# Patient Record
Sex: Female | Born: 1960 | Race: White | Hispanic: No | Marital: Married | Smoking: Current every day smoker
Health system: Southern US, Community
[De-identification: ages and names within clinical notes are randomized; demographics above are authoritative.]

## PROBLEM LIST (undated history)

## (undated) DIAGNOSIS — G4700 Insomnia, unspecified: Secondary | ICD-10-CM

## (undated) DIAGNOSIS — G8929 Other chronic pain: Secondary | ICD-10-CM

## (undated) DIAGNOSIS — M549 Dorsalgia, unspecified: Secondary | ICD-10-CM

## (undated) DIAGNOSIS — F419 Anxiety disorder, unspecified: Secondary | ICD-10-CM

## (undated) HISTORY — PX: PANNICULECTOMY: SUR1001

## (undated) HISTORY — PX: GASTRIC BYPASS: SHX52

## (undated) HISTORY — PX: TUBAL LIGATION: SHX77

## (undated) HISTORY — PX: CHOLECYSTECTOMY: SHX55

## (undated) HISTORY — PX: TONSILLECTOMY: SUR1361

## (undated) HISTORY — PX: ECTOPIC PREGNANCY SURGERY: SHX613

## (undated) HISTORY — PX: BACK SURGERY: SHX140

---

## 2015-02-08 ENCOUNTER — Encounter (HOSPITAL_BASED_OUTPATIENT_CLINIC_OR_DEPARTMENT_OTHER): Payer: Self-pay | Admitting: Emergency Medicine

## 2015-02-08 ENCOUNTER — Emergency Department (HOSPITAL_BASED_OUTPATIENT_CLINIC_OR_DEPARTMENT_OTHER)
Admission: EM | Admit: 2015-02-08 | Discharge: 2015-02-09 | Disposition: A | Discharge status: Home / Self Care | Payer: 59 | Attending: Emergency Medicine | Admitting: Emergency Medicine

## 2015-02-08 DIAGNOSIS — G8929 Other chronic pain: Secondary | ICD-10-CM | POA: Diagnosis not present

## 2015-02-08 DIAGNOSIS — Z880 Allergy status to penicillin: Secondary | ICD-10-CM | POA: Insufficient documentation

## 2015-02-08 DIAGNOSIS — Y9289 Other specified places as the place of occurrence of the external cause: Secondary | ICD-10-CM | POA: Diagnosis not present

## 2015-02-08 DIAGNOSIS — F17200 Nicotine dependence, unspecified, uncomplicated: Secondary | ICD-10-CM | POA: Insufficient documentation

## 2015-02-08 DIAGNOSIS — Z9889 Other specified postprocedural states: Secondary | ICD-10-CM

## 2015-02-08 DIAGNOSIS — Y998 Other external cause status: Secondary | ICD-10-CM | POA: Insufficient documentation

## 2015-02-08 DIAGNOSIS — W002XXA Other fall from one level to another due to ice and snow, initial encounter: Secondary | ICD-10-CM | POA: Insufficient documentation

## 2015-02-08 DIAGNOSIS — S82852A Displaced trimalleolar fracture of left lower leg, initial encounter for closed fracture: Secondary | ICD-10-CM | POA: Diagnosis not present

## 2015-02-08 DIAGNOSIS — Z8669 Personal history of other diseases of the nervous system and sense organs: Secondary | ICD-10-CM | POA: Diagnosis not present

## 2015-02-08 DIAGNOSIS — IMO0001 Reserved for inherently not codable concepts without codable children: Secondary | ICD-10-CM

## 2015-02-08 DIAGNOSIS — W009XXA Unspecified fall due to ice and snow, initial encounter: Secondary | ICD-10-CM | POA: Diagnosis not present

## 2015-02-08 DIAGNOSIS — Y9389 Activity, other specified: Secondary | ICD-10-CM | POA: Insufficient documentation

## 2015-02-08 DIAGNOSIS — Z8659 Personal history of other mental and behavioral disorders: Secondary | ICD-10-CM | POA: Diagnosis not present

## 2015-02-08 DIAGNOSIS — S99912A Unspecified injury of left ankle, initial encounter: Secondary | ICD-10-CM | POA: Diagnosis present

## 2015-02-08 HISTORY — DX: Other chronic pain: G89.29

## 2015-02-08 HISTORY — DX: Insomnia, unspecified: G47.00

## 2015-02-08 HISTORY — DX: Dorsalgia, unspecified: M54.9

## 2015-02-08 HISTORY — DX: Anxiety disorder, unspecified: F41.9

## 2015-02-08 NOTE — ED Notes (Addendum)
Pt slipped on ice and fell. Pt has obvious deformity to left ankle. Pt has good pedal pulse. Pt reports taking hydrocodone 7.5/325 mg at 2000 (regular scheduled dose prior to falling) and repeated the same dose at 2300 after falling. Pt is slightly hypotensive-pt states this is her normal BP range. Pt also reports drinking 6 beers tonight.

## 2015-02-09 ENCOUNTER — Emergency Department (HOSPITAL_BASED_OUTPATIENT_CLINIC_OR_DEPARTMENT_OTHER): Payer: 59

## 2015-02-09 MED ORDER — FENTANYL CITRATE (PF) 100 MCG/2ML IJ SOLN
100.0000 ug | Freq: Once | INTRAMUSCULAR | Status: AC
  Administered 2015-02-09: 100 ug via INTRAVENOUS
  Filled 2015-02-09: qty 2

## 2015-02-09 MED ORDER — ETOMIDATE 2 MG/ML IV SOLN
0.1500 mg/kg | Freq: Once | INTRAVENOUS | Status: AC
  Administered 2015-02-09: 12.94 mg via INTRAVENOUS
  Filled 2015-02-09: qty 10

## 2015-02-09 MED ORDER — OXYCODONE-ACETAMINOPHEN 5-325 MG PO TABS: 1.0000 | ORAL_TABLET | ORAL | Status: AC | PRN

## 2015-02-09 NOTE — Discharge Instructions (Signed)
Ankle Fracture A fracture is a break in a bone. The ankle joint is made up of three bones. These include the lower (distal)sections of your lower leg bones, called the tibia and fibula, along with a bone in your foot, called the talus. Depending on how bad the break is and if more than one ankle joint bone is broken, a cast or splint is used to protect and keep your injured bone from moving while it heals. Sometimes, surgery is required to help the fracture heal properly.  There are two general types of fractures:  Stable fracture. This includes a single fracture line through one bone, with no injury to ankle ligaments. A fracture of the talus that does not have any displacement (movement of the bone on either side of the fracture line) is also stable.  Unstable fracture. This includes more than one fracture line through one or more bones in the ankle joint. It also includes fractures that have displacement of the bone on either side of the fracture line. CAUSES  A direct blow to the ankle.   Quickly and severely twisting your ankle.  Trauma, such as a car accident or falling from a significant height. RISK FACTORS You may be at a higher risk of ankle fracture if:  You have certain medical conditions.  You are involved in high-impact sports.  You are involved in a high-impact car accident. SIGNS AND SYMPTOMS   Tender and swollen ankle.  Bruising around the injured ankle.  Pain on movement of the ankle.  Difficulty walking or putting weight on the ankle.  A cold foot below the site of the ankle injury. This can occur if the blood vessels passing through your injured ankle were also damaged.  Numbness in the foot below the site of the ankle injury. DIAGNOSIS  An ankle fracture is usually diagnosed with a physical exam and X-rays. A CT scan may also be required for complex fractures. TREATMENT  Stable fractures are treated with a cast or splint and using crutches to avoid putting  weight on your injured ankle. This is followed by an ankle strengthening program. Some patients require a special type of cast, depending on other medical problems they may have. Unstable fractures require surgery to ensure the bones heal properly. Your health care provider will tell you what type of fracture you have and the best treatment for your condition. HOME CARE INSTRUCTIONS   Review correct crutch use with your health care provider and use your crutches as directed. Safe use of crutches is extremely important. Misuse of crutches can cause you to fall or cause injury to nerves in your hands or armpits.  Do not put weight or pressure on the injured ankle until directed by your health care provider.  To lessen the swelling, keep the injured leg elevated while sitting or lying down.  Apply ice to the injured area:  Put ice in a plastic bag.  Place a towel between your cast and the bag.  Leave the ice on for 20 minutes, 2-3 times a day.  If you have a plaster or fiberglass cast:  Do not try to scratch the skin under the cast with any objects. This can increase your risk of skin infection.  Check the skin around the cast every day. You may put lotion on any red or sore areas.  Keep your cast dry and clean.  If you have a plaster splint:  Wear the splint as directed.  You may loosen the elastic   around the splint if your toes become numb, tingle, or turn cold or blue.  Do not put pressure on any part of your cast or splint; it may break. Rest your cast only on a pillow the first 24 hours until it is fully hardened.  Your cast or splint can be protected during bathing with a plastic bag sealed to your skin with medical tape. Do not lower the cast or splint into water.  Take medicines as directed by your health care provider. Only take over-the-counter or prescription medicines for pain, discomfort, or fever as directed by your health care provider.  Do not drive a vehicle until  your health care provider specifically tells you it is safe to do so.  If your health care provider has given you a follow-up appointment, it is very important to keep that appointment. Not keeping the appointment could result in a chronic or permanent injury, pain, and disability. If you have any problem keeping the appointment, call the facility for assistance. SEEK MEDICAL CARE IF: You develop increased swelling or discomfort. SEEK IMMEDIATE MEDICAL CARE IF:   Your cast gets damaged or breaks.  You have continued severe pain.  You develop new pain or swelling after the cast was put on.  Your skin or toenails below the injury turn blue or gray.  Your skin or toenails below the injury feel cold, numb, or have loss of sensitivity to touch.  There is a bad smell or pus draining from under the cast. MAKE SURE YOU:   Understand these instructions.  Will watch your condition.  Will get help right away if you are not doing well or get worse.   This information is not intended to replace advice given to you by your health care provider. Make sure you discuss any questions you have with your health care provider.   Document Released: 01/13/2000 Document Revised: 01/20/2013 Document Reviewed: 08/14/2012 [employer] Interactive Patient Education 2016 [employer]  

## 2015-02-09 NOTE — ED Provider Notes (Addendum)
CSN: [id number]     Arrival date & time 02/08/15  2341 History   First MD Initiated Contact with Patient 02/09/15 0037     Chief Complaint  Patient presents with  . Ankle Injury     (Consider location/radiation/quality/duration/timing/severity/associated sxs/prior Treatment) HPI  This is a 55 year old female on chronic narcotics for low back pain. She slipped and fell on the ice about 9 PM yesterday evening. She is complaining of deformity and moderate to severe pain in her left ankle. She is not able to bear weight on the left ankle. She has felt crepitus in the left ankle. She denies other injury. She had been drinking beer yesterday evening. Her blood pressure is noted to be 100/48 but she states her blood pressure is usually low.  Past Medical History  Diagnosis Date  . Chronic back pain   . Insomnia   . Anxiety    Past Surgical History  Procedure Laterality Date  . Back surgery    . Tubal ligation    . Ectopic pregnancy surgery    . Tonsillectomy    . Panniculectomy    . Gastric bypass    . Cholecystectomy     No family history on file. Social History  Substance Use Topics  . Smoking status: Current Every Day Smoker  . Smokeless tobacco: None  . Alcohol Use: 3.6 oz/week    6 Cans of beer per week   OB History    No data available     Review of Systems  All other systems reviewed and are negative.   Allergies  Penicillins  Home Medications   Prior to Admission medications   Medication Sig Start Date End Date Taking? Authorizing Provider  HYDROcodone-acetaminophen (NORCO) 7.5-325 MG tablet Take 1 tablet by mouth every 4 (four) hours as needed for moderate pain.   Yes Historical Provider, MD   BP 105/56 mmHg  Pulse 69  Temp(Src) 98 F (36.7 C) (Oral)  Resp 11  Ht 5' (1.524 m)  Wt 190 lb (86.183 kg)  BMI 37.11 kg/m2  SpO2 99%   Physical Exam  General: Well-developed, well-nourished female in no acute distress; appearance consistent with age of  record HENT: normocephalic; atraumatic Eyes: pupils equal, round and reactive to light; extraocular muscles intact Neck: supple; no C-spine tenderness Heart: regular rate and rhythm Lungs: clear to auscultation bilaterally Abdomen: soft; nondistended; nontender; bowel sounds present Back: No spinal tenderness Extremities: Pulses normal; deformity, tenderness and decreased range of motion of left ankle, left foot distally neurovascularly intact, no proximal fibular tenderness Neurologic: Awake, alert and oriented; motor function intact in all extremities and symmetric; no facial droop Skin: Warm and dry Psychiatric: Normal mood and affect    ED Course  Procedures (including critical care time)  Procedural sedation Performed by: Barth Trella L Consent: Verbal consent obtained. Risks and benefits: risks, benefits and alternatives were discussed Required items: required blood products, implants, devices, and special equipment available Patient identity confirmed: arm band and provided demographic data Time out: Immediately prior to procedure a "time out" was called to verify the correct patient, procedure, equipment, support staff and site/side marked as required.  Sedation type: moderate (conscious) sedation NPO time confirmed and considedered  Sedatives: ETOMIDATE  Physician Time at Bedside: 10 minutes  Vitals: Vital signs were monitored during sedation. Cardiac Monitor, pulse oximeter Patient tolerance: Patient tolerated the procedure well with no immediate complications. Comments: Pt with uneventful recovered. Returned to pre-procedural sedation baseline  CLOSED REDUCTION AND SPLINTING After adequate  sedation was obtained using etomidate as above the patient's ankle was reduced using traction. A posterior short leg splint and stirrup splint were then applied with the assistance of the orthopedic technician. The patient tolerated this well and there were no immediate  complications. The foot remains neurovascularly intact. Follow-up x-ray is pending.   MDM  Nursing notes and vitals signs, including pulse oximetry, reviewed.  Summary of this visit's results, reviewed by myself:  Imaging Studies: Dg Ankle Complete Left  02/09/2015  CLINICAL DATA:  55 year old female with left ankle injury. EXAM: LEFT ANKLE COMPLETE - 3+ VIEW COMPARISON:  None. FINDINGS: There is comminuted fracture of the lateral malleolus with lateral angulation of the distal fracture fragment. There is a laterally displaced fracture of the medial malleolus. There is also fracture of the posterior tibial plafond. There is lateral dislocation of the ankle joint. There is diffuse soft tissue swelling of the ankle. IMPRESSION: Trimalleolar fracture with lateral dislocation of the ankle. Electronically Signed   By: Elgie CollardArash  Radparvar M.D.   On: 02/09/2015 00:29   Dg Ankle Left Port  02/09/2015  CLINICAL DATA:  Ankle fracture, postreduction. EXAM: PORTABLE LEFT ANKLE - 2 VIEW COMPARISON:  Pre reduction radiographs earlier today. FINDINGS: Improved alignment of trimalleolar fracture postreduction. There is residual talar tilt in widening of the medial mortise. Overlying cast material limits osseous and soft tissue fine detail. IMPRESSION: Improved alignment of trimalleolar fracture postreduction with residual talar tilt. Electronically Signed   By: Rubye OaksMelanie  Ehinger M.D.   On: 02/09/2015 02:19   Patient is on a pain management contract. We will provide only a few additional pain pills to help keep her comfortable until she contacts her pain management physician later today.    Paula LibraJohn Ritaj Dullea, MD 02/09/15 [id number]  Paula LibraJohn Chanell Nadeau, MD 02/09/15 [id number]

## 2015-02-09 NOTE — Sedation Documentation (Signed)
Cast being placed at this time.

## 2017-05-29 IMAGING — DX DG ANKLE COMPLETE 3+V*L*
3 series · 3 of 3 positions shown · non-contrast
Comparison: None.

CLINICAL DATA: 54-year-old female with left ankle injury.

EXAM:
LEFT ANKLE COMPLETE - 3+ VIEW

[ankle ap]
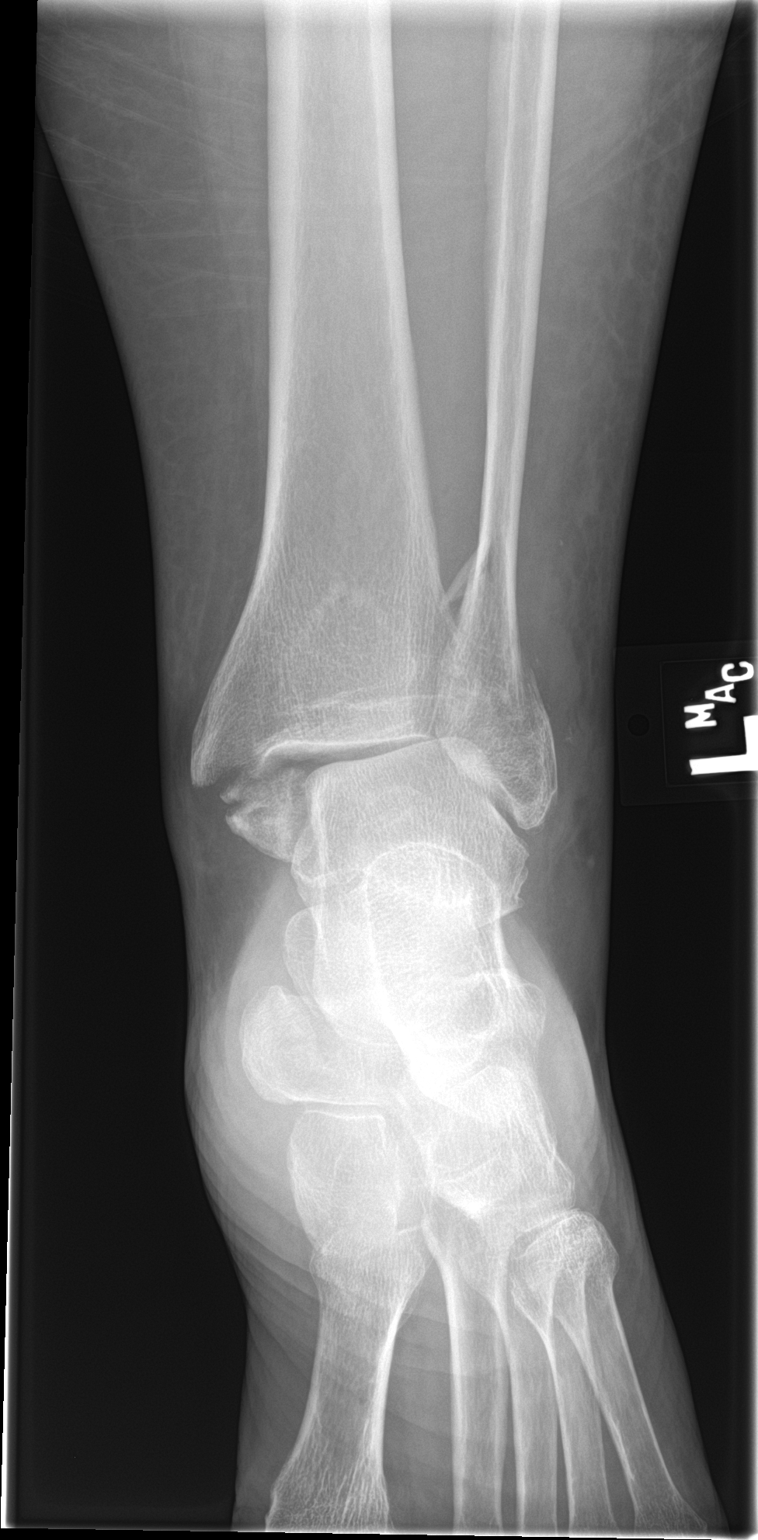

[ankle obl]
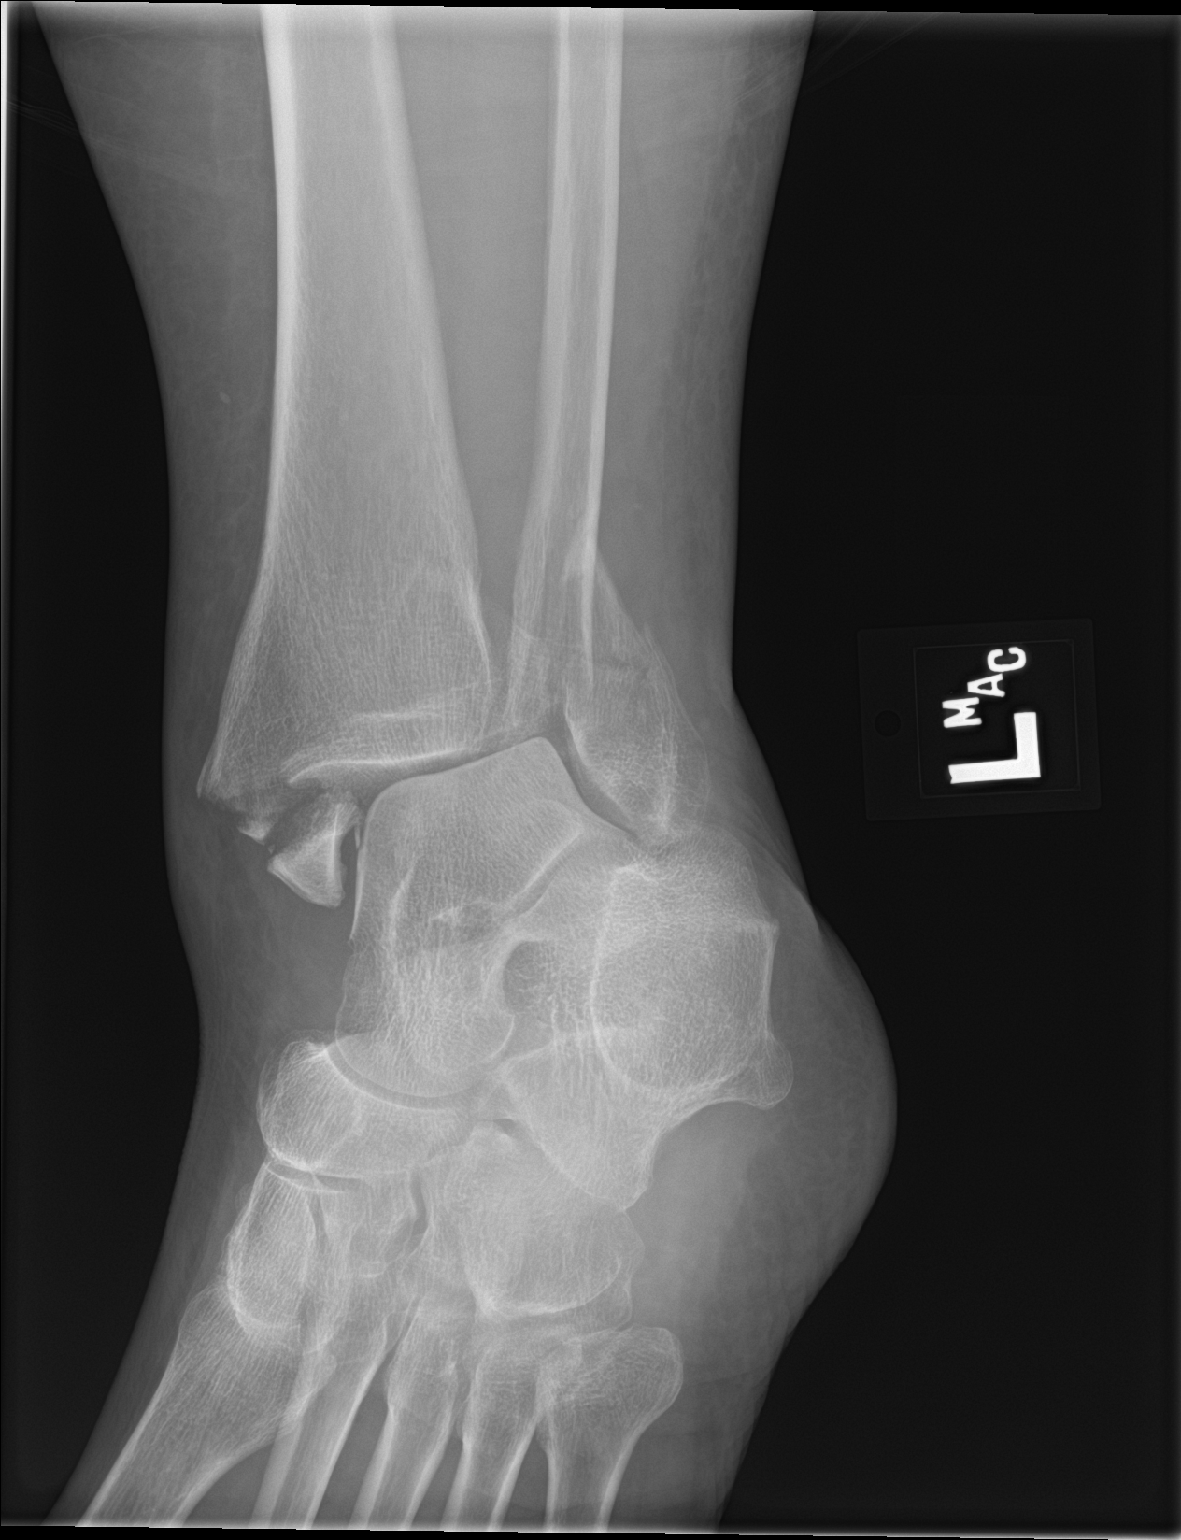

[ankle lat]
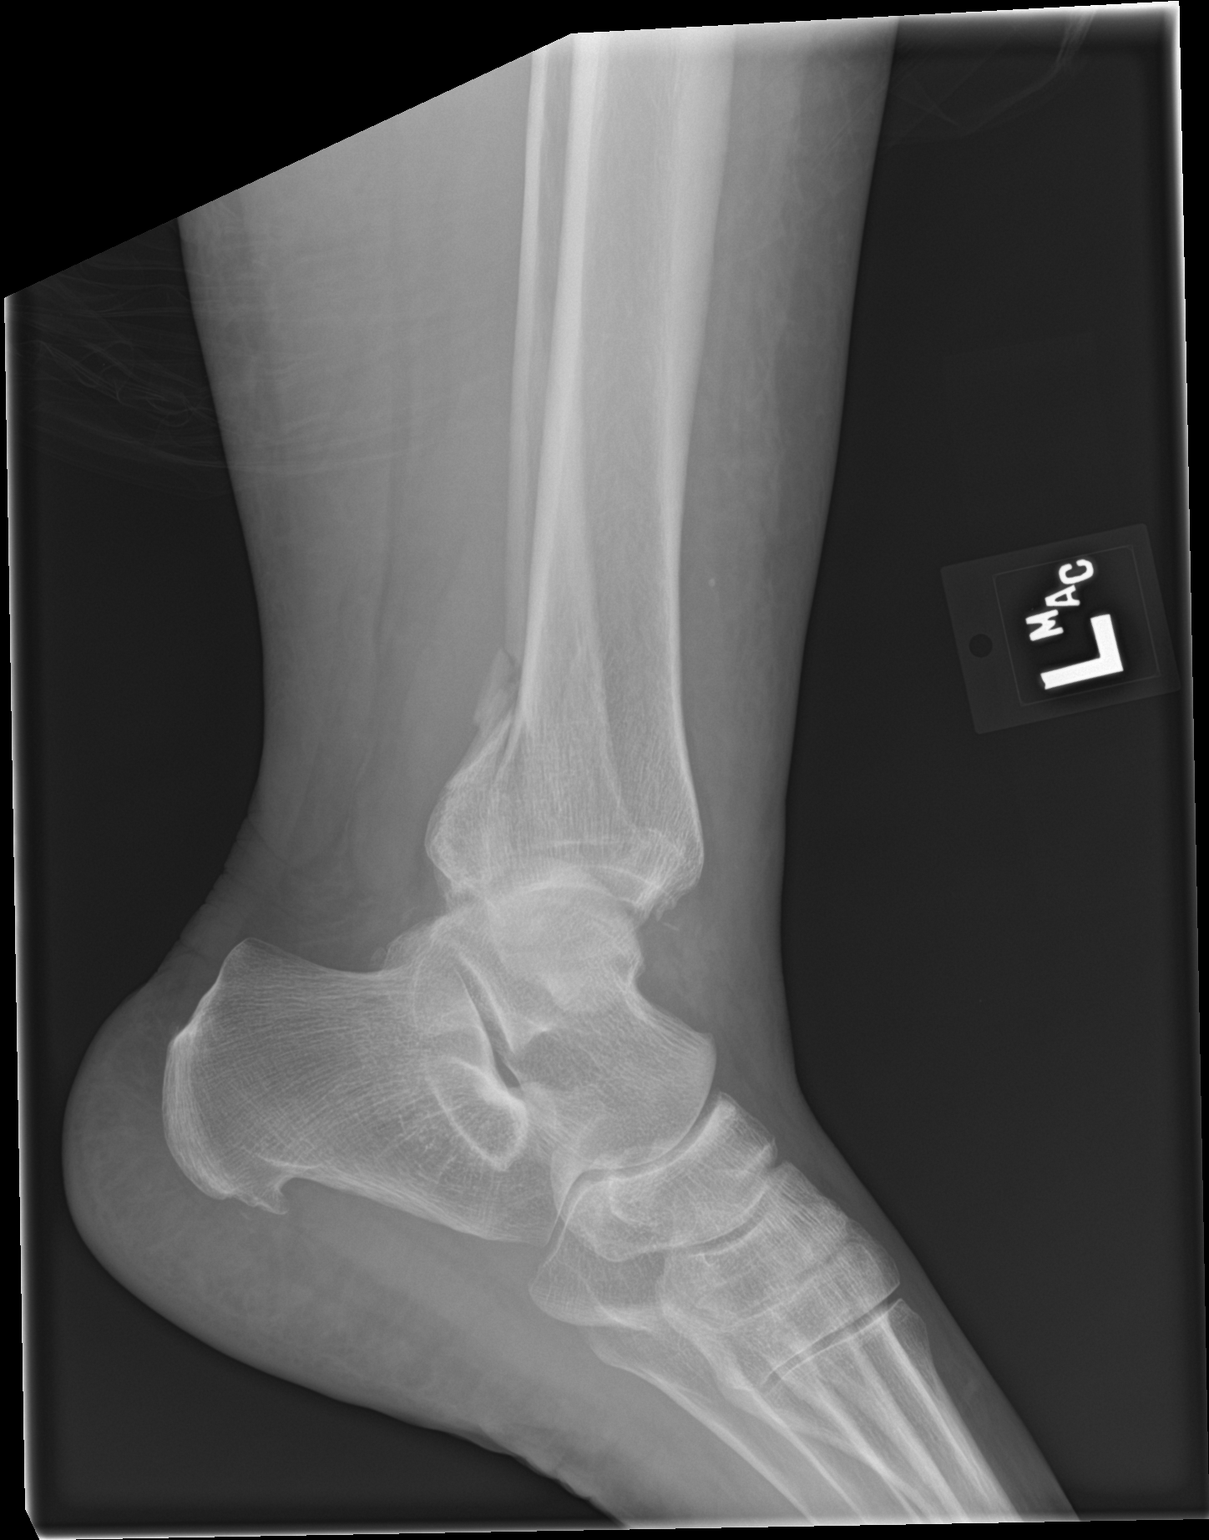

[3 of 3 positions shown; findings below may reference images not displayed]

FINDINGS: There is comminuted fracture of the lateral malleolus with lateral
angulation of the distal fracture fragment. There is a laterally
displaced fracture of the medial malleolus. There is also fracture
of the posterior tibial plafond. There is lateral dislocation of the
ankle joint. There is diffuse soft tissue swelling of the ankle.
IMPRESSION: Trimalleolar fracture with lateral dislocation of the ankle.

## 2017-05-29 IMAGING — DX DG ANKLE PORT 2V*L*
2 series · 2 of 2 positions shown · non-contrast
Comparison: Pre reduction radiographs earlier today.

CLINICAL DATA: Ankle fracture, postreduction.

EXAM:
PORTABLE LEFT ANKLE - 2 VIEW

[ankle lat]
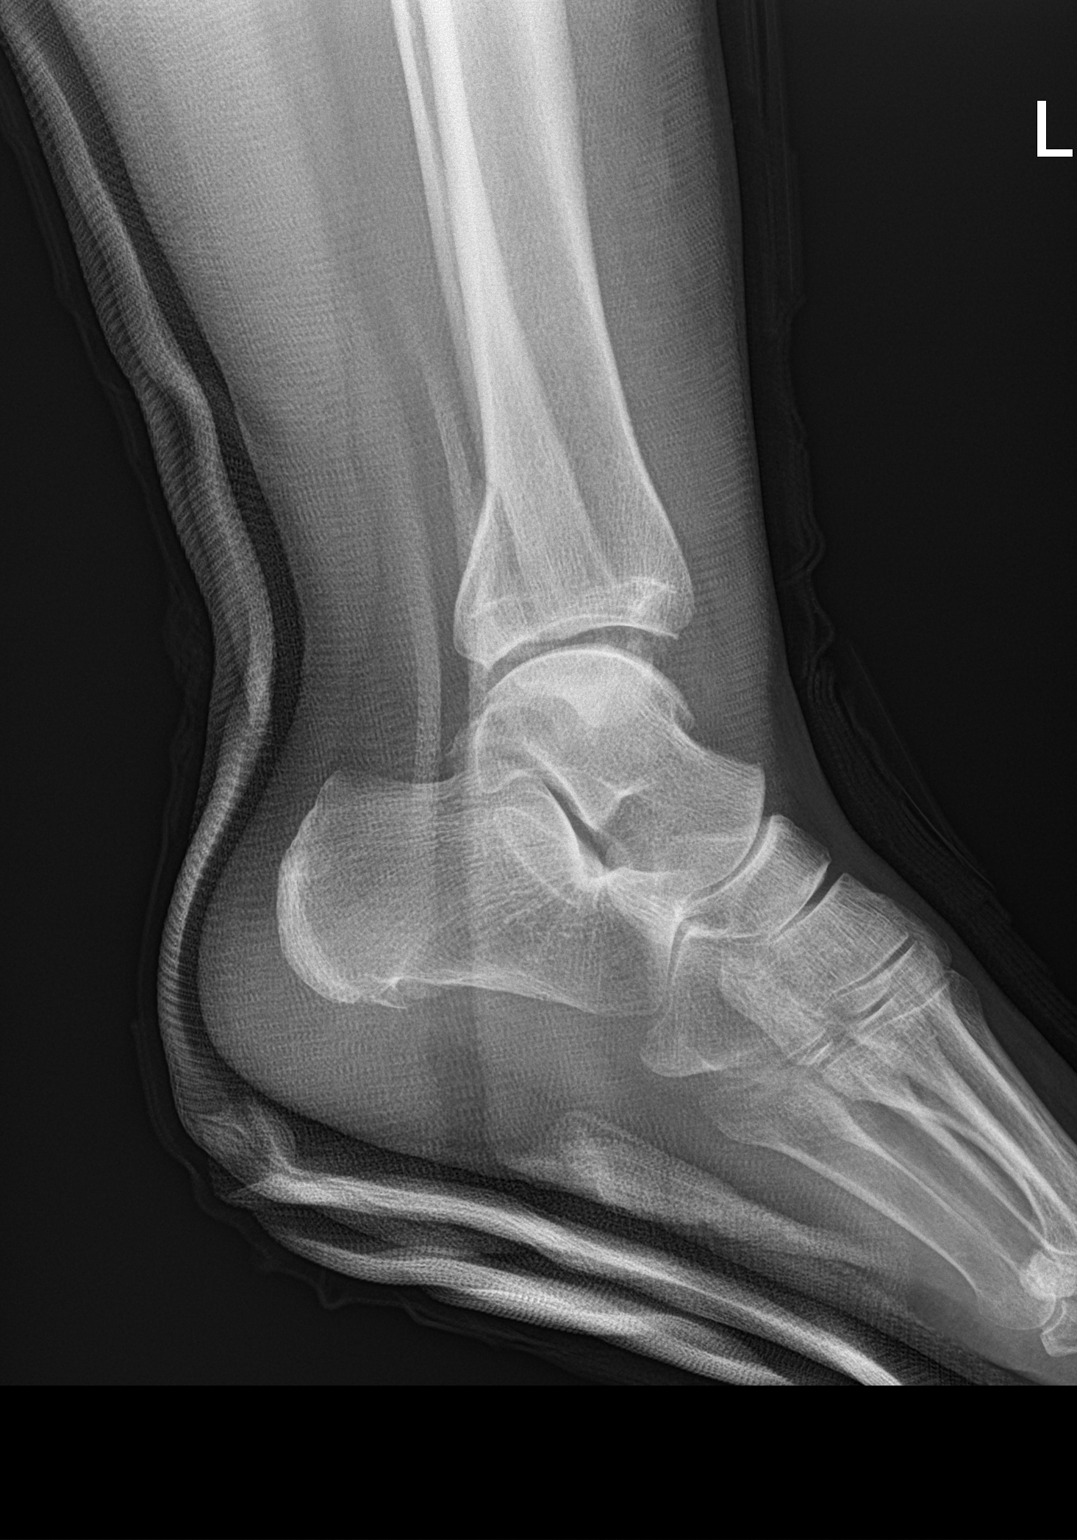

[ankle ap]
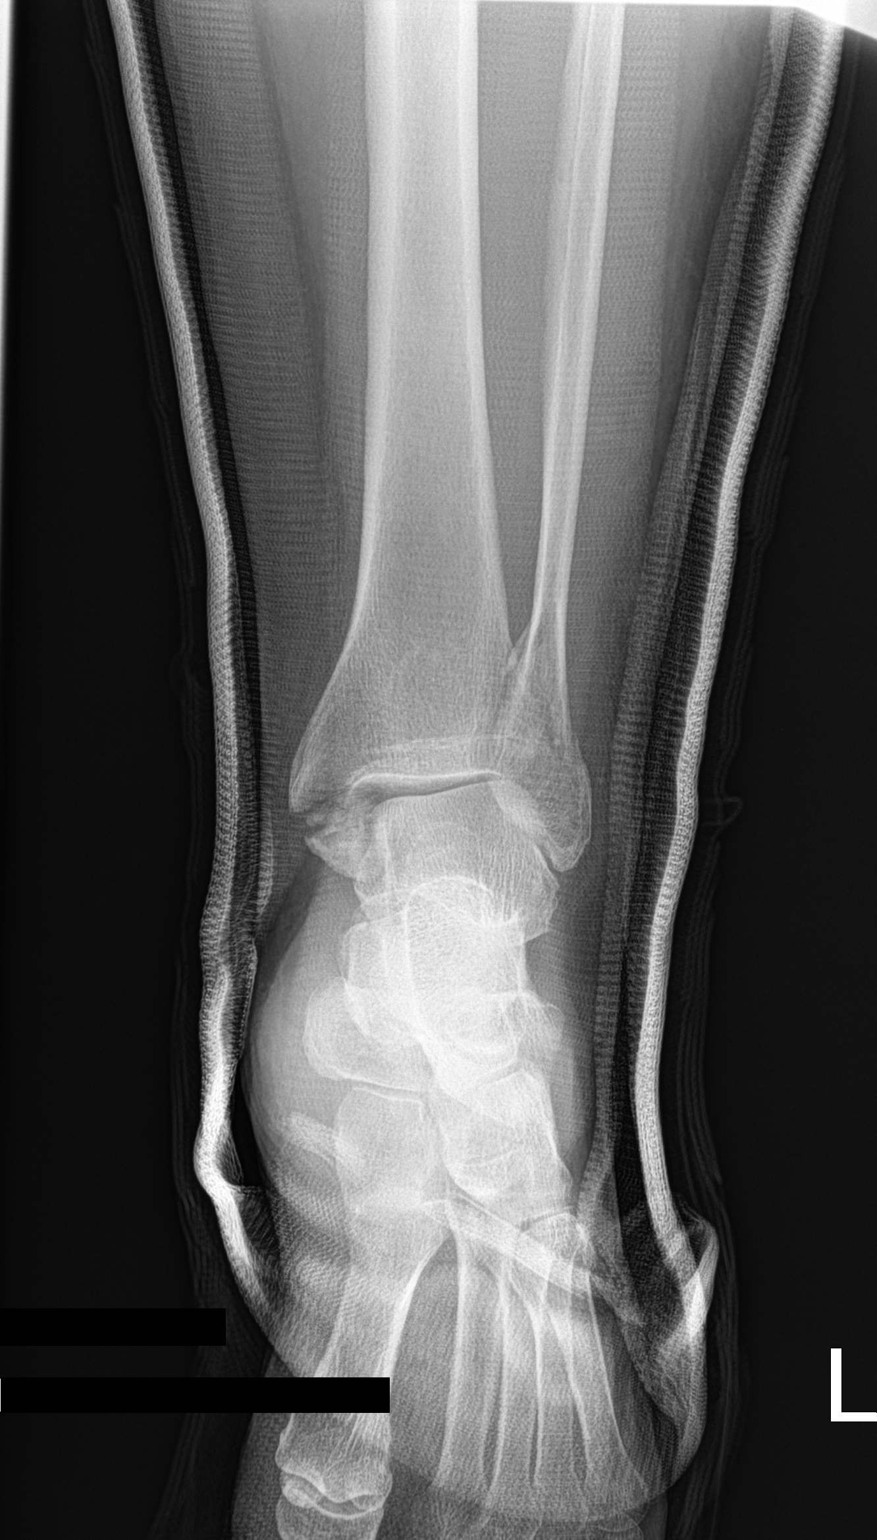

[2 of 2 positions shown; findings below may reference images not displayed]

FINDINGS: Improved alignment of trimalleolar fracture postreduction. There is
residual talar tilt in widening of the medial mortise. Overlying
cast material limits osseous and soft tissue fine detail.
IMPRESSION: Improved alignment of trimalleolar fracture postreduction with
residual talar tilt.

## 2021-08-25 ENCOUNTER — Encounter: Payer: Self-pay | Admitting: Internal Medicine

## 2022-05-10 ENCOUNTER — Other Ambulatory Visit (HOSPITAL_COMMUNITY): Payer: Self-pay

## 2022-05-10 MED ORDER — HYDROMORPHONE HCL 4 MG PO TABS
4.0000 mg | ORAL_TABLET | Freq: Four times a day (QID) | ORAL | 0 refills | Status: AC | PRN
  Filled 2022-05-10: qty 150, 30d supply, fill #0
